# Patient Record
Sex: Male | Born: 2000 | Race: Black or African American | Hispanic: No | Marital: Single | Smoking: Never smoker
Health system: Southern US, Community
[De-identification: ages and names within clinical notes are randomized; demographics above are authoritative.]

## PROBLEM LIST (undated history)

## (undated) DIAGNOSIS — T7840XA Allergy, unspecified, initial encounter: Secondary | ICD-10-CM

## (undated) DIAGNOSIS — F909 Attention-deficit hyperactivity disorder, unspecified type: Secondary | ICD-10-CM

## (undated) DIAGNOSIS — J45909 Unspecified asthma, uncomplicated: Secondary | ICD-10-CM

## (undated) HISTORY — DX: Allergy, unspecified, initial encounter: T78.40XA

## (undated) HISTORY — DX: Attention-deficit hyperactivity disorder, unspecified type: F90.9

## (undated) HISTORY — DX: Unspecified asthma, uncomplicated: J45.909

---
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
## 2023-01-25 ENCOUNTER — Emergency Department (HOSPITAL_BASED_OUTPATIENT_CLINIC_OR_DEPARTMENT_OTHER): Payer: BC Managed Care – PPO

## 2023-01-25 ENCOUNTER — Emergency Department (HOSPITAL_BASED_OUTPATIENT_CLINIC_OR_DEPARTMENT_OTHER)
Admission: EM | Admit: 2023-01-25 | Discharge: 2023-01-25 | Disposition: A | Discharge status: Home / Self Care | Payer: BC Managed Care – PPO | Attending: Emergency Medicine | Admitting: Emergency Medicine

## 2023-01-25 ENCOUNTER — Other Ambulatory Visit: Payer: Self-pay

## 2023-01-25 ENCOUNTER — Encounter (HOSPITAL_BASED_OUTPATIENT_CLINIC_OR_DEPARTMENT_OTHER): Payer: Self-pay | Admitting: Radiology

## 2023-01-25 ENCOUNTER — Other Ambulatory Visit (HOSPITAL_BASED_OUTPATIENT_CLINIC_OR_DEPARTMENT_OTHER): Payer: Self-pay

## 2023-01-25 DIAGNOSIS — K6389 Other specified diseases of intestine: Secondary | ICD-10-CM

## 2023-01-25 DIAGNOSIS — R1031 Right lower quadrant pain: Secondary | ICD-10-CM | POA: Diagnosis present

## 2023-01-25 DIAGNOSIS — K659 Peritonitis, unspecified: Secondary | ICD-10-CM | POA: Insufficient documentation

## 2023-01-25 LAB — CBC
HCT: 43.5 % (ref 39.0–52.0)
Hemoglobin: 14.9 g/dL (ref 13.0–17.0)
MCH: 30.5 pg (ref 26.0–34.0)
MCHC: 34.3 g/dL (ref 30.0–36.0)
MCV: 89.1 fL (ref 80.0–100.0)
Platelets: 179 10*3/uL (ref 150–400)
RBC: 4.88 MIL/uL (ref 4.22–5.81)
RDW: 11.5 % (ref 11.5–15.5)
WBC: 5.9 10*3/uL (ref 4.0–10.5)
nRBC: 0 % (ref 0.0–0.2)

## 2023-01-25 LAB — URINALYSIS, ROUTINE W REFLEX MICROSCOPIC
Bilirubin Urine: NEGATIVE
Glucose, UA: NEGATIVE mg/dL
Hgb urine dipstick: NEGATIVE
Ketones, ur: NEGATIVE mg/dL
Leukocytes,Ua: NEGATIVE
Nitrite: NEGATIVE
Specific Gravity, Urine: 1.024 (ref 1.005–1.030)
pH: 8 (ref 5.0–8.0)

## 2023-01-25 LAB — COMPREHENSIVE METABOLIC PANEL
ALT: 6 U/L (ref 0–44)
AST: 15 U/L (ref 15–41)
Albumin: 4.9 g/dL (ref 3.5–5.0)
Alkaline Phosphatase: 42 U/L (ref 38–126)
Anion gap: 8 (ref 5–15)
BUN: 9 mg/dL (ref 6–20)
CO2: 26 mmol/L (ref 22–32)
Calcium: 10.1 mg/dL (ref 8.9–10.3)
Chloride: 105 mmol/L (ref 98–111)
Creatinine, Ser: 0.91 mg/dL (ref 0.61–1.24)
GFR, Estimated: >60 mL/min (ref >60)
Glucose, Bld: 86 mg/dL (ref 70–99)
Potassium: 3.9 mmol/L (ref 3.5–5.1)
Sodium: 139 mmol/L (ref 135–145)
Total Bilirubin: 1.1 mg/dL (ref 0.3–1.2)
Total Protein: 7.9 g/dL (ref 6.5–8.1)

## 2023-01-25 LAB — LIPASE, BLOOD: Lipase: 12 U/L (ref 11–51)

## 2023-01-25 MED ORDER — IOHEXOL 300 MG/ML  SOLN
100.0000 mL | Freq: Once | INTRAMUSCULAR | Status: AC | PRN
  Administered 2023-01-25: 75 mL via INTRAVENOUS

## 2023-01-25 MED ORDER — IBUPROFEN 600 MG PO TABS
600.0000 mg | ORAL_TABLET | Freq: Four times a day (QID) | ORAL | 0 refills | Status: AC | PRN
  Filled 2023-01-25: qty 30, 8d supply, fill #0

## 2023-01-25 NOTE — Discharge Instructions (Signed)
As discussed, symptoms are likely secondary to something called epiploic appendagitis.  This is not a life-threatening/emergent condition.  Recommend Tylenol/Motrin as needed for pain.  Symptoms will resolve with time.  Recommend follow-up with primary care for reassessment of your symptoms.  Please do not hesitate to return to emergency department if the worrisome signs or symptoms we discussed become apparent.

## 2023-01-25 NOTE — ED Triage Notes (Addendum)
Patient arrives with complaints of abdominal pain x1 day. Patient states that pain was worse with movement this morning and he was seen at Urgent Care. Patient was sent here to have imaging and rule out appendicitis.   Rates pain a 3/10

## 2023-01-25 NOTE — ED Provider Notes (Signed)
EMERGENCY DEPARTMENT AT [facility] Provider Note   CSN: [id number] Arrival date & time: 01/25/23  1014     History  Chief Complaint  Patient presents with   Abdominal Pain    Jason Jefferson is a 22 y.o. male.   Abdominal Pain   22 year old male presents emergency department with complaints of abdominal pain.  Patient is abdominal pain began this morning, is located in right lower abdomen.  Was seen urgent care earlier today and sent to the emergency department for further rule out of appendicitis.  States he was given to ibuprofen as well as Maalox to urgent care which has significantly improved his symptoms.  Presents emergency department for further assessment.  Denies fever, chills, chest pain, shortness of breath, nausea, vomiting, urinary symptoms, change in bowel habits.  Denies history abdominal surgeries.  Denies radiation of pain.  No significant pertinent past medical history. Home Medications Prior to Admission medications   Medication Sig Start Date End Date Taking? Authorizing Provider  ibuprofen (ADVIL) 600 MG tablet Take 1 tablet (600 mg total) by mouth every 6 (six) hours as needed. 01/25/23  Yes Sherian Maroon A, PA  methylphenidate (DAYTRANA) 20 MG/9HR Place 1 patch onto the skin daily. wear patch for 9 hours only each day    [provider]      Allergies    Patient has no known allergies.    Review of Systems   Review of Systems  Gastrointestinal:  Positive for abdominal pain.  All other systems reviewed and are negative.   Physical Exam Updated Vital Signs BP 119/79   Pulse 82   Temp 98 F (36.7 C) (Oral)   Resp 18   Ht 5\' 10"  (1.778 m)   Wt 72.6 kg   SpO2 100%   BMI 22.96 kg/m  Physical Exam Vitals and nursing note reviewed.  Constitutional:      General: He is not in acute distress.    Appearance: He is well-developed.  HENT:     Head: Normocephalic and atraumatic.  Eyes:     Conjunctiva/sclera: Conjunctivae  normal.  Cardiovascular:     Rate and Rhythm: Normal rate and regular rhythm.     Heart sounds: No murmur heard. Pulmonary:     Effort: Pulmonary effort is normal. No respiratory distress.     Breath sounds: Normal breath sounds.  Abdominal:     Palpations: Abdomen is soft.     Tenderness: There is abdominal tenderness in the right lower quadrant. There is no right CVA tenderness or left CVA tenderness.     Comments: Mild right lower quadrant tenderness to palpation.  Musculoskeletal:        General: No swelling.     Cervical back: Neck supple.  Skin:    General: Skin is warm and dry.     Capillary Refill: Capillary refill takes less than 2 seconds.  Neurological:     Mental Status: He is alert.  Psychiatric:        Mood and Affect: Mood normal.     ED Results / Procedures / Treatments   Labs (all labs ordered are listed, but only abnormal results are displayed) Labs Reviewed  URINALYSIS, ROUTINE W REFLEX MICROSCOPIC - Abnormal; Notable for the following components:      Result Value   Protein, ur TRACE (*)    All other components within normal limits  LIPASE, BLOOD  COMPREHENSIVE METABOLIC PANEL  CBC    EKG None  Radiology CT  ABDOMEN PELVIS W CONTRAST  Result Date: 01/25/2023 CLINICAL DATA:  One day history of right lower quadrant abdominal pain EXAM: CT ABDOMEN AND PELVIS WITH CONTRAST TECHNIQUE: Multidetector CT imaging of the abdomen and pelvis was performed using the standard protocol following bolus administration of intravenous contrast. RADIATION DOSE REDUCTION: This exam was performed according to the departmental dose-optimization program which includes automated exposure control, adjustment of the mA and/or kV according to patient size and/or use of iterative reconstruction technique. CONTRAST:  75mL OMNIPAQUE IOHEXOL 300 MG/ML  SOLN COMPARISON:  None Available. FINDINGS: Lower chest: No focal consolidation or pulmonary nodule in the lung bases. No pleural  effusion or pneumothorax demonstrated. Partially imaged heart size is normal. Hepatobiliary: No focal hepatic lesions. Mild periportal edema. No intra or extrahepatic biliary ductal dilation. Normal gallbladder. Pancreas: No focal lesions or main ductal dilation. Spleen: Normal in size without focal abnormality. Splenule along the superomedial spleen. Adrenals/Urinary Tract: No adrenal nodules. No suspicious renal mass, calculi or hydronephrosis. No focal bladder wall thickening. Stomach/Bowel: Normal appearance of the stomach. No evidence of bowel wall thickening or distention. Pericolonic hyperattenuating focus with surrounding mesenteric stranding (2:45). Scattered ascending colonic diverticula. Normal appendix. Vascular/Lymphatic: No significant vascular findings are present. No enlarged abdominal or pelvic lymph nodes. Reproductive: Prostate is unremarkable. Other: Trace pelvic free fluid.  No free air or fluid collection. Musculoskeletal: No acute or abnormal lytic or blastic osseous lesions. IMPRESSION: 1. Findings suggestive of epiploic appendagitis of the ascending colon. 2. Scattered ascending colonic diverticula with no findings of acute diverticulitis. 3. Normal appendix. Electronically Signed   By: Agustin Cree M.D.   On: 01/25/2023 12:08    Procedures Procedures    Medications Ordered in ED Medications  iohexol (OMNIPAQUE) 300 MG/ML solution 100 mL (75 mLs Intravenous Contrast Given 01/25/23 1144)    ED Course/ Medical Decision Making/ A&P                             Medical Decision Making Amount and/or Complexity of Data Reviewed Labs: ordered. Radiology: ordered.  Risk Prescription drug management.   This patient presents to the ED for concern of abdominal pain, this involves an extensive number of treatment options, and is a complaint that carries with it a high risk of complications and morbidity.  The differential diagnosis includes gastritis, pancreatitis, CBD pathology,  cholecystitis, hepatitis, SBO/LBO, volvulus, diverticulitis, appendicitis, pyelonephritis, nephrolithiasis, cystitis    Co morbidities that complicate the patient evaluation  See HPI   Additional history obtained:  Additional history obtained from EMR External records from outside source obtained and reviewed including hospital records   Lab Tests:  I Ordered, and personally interpreted labs.  The pertinent results include: No leukocytosis.  No evidence of anemia.  Platelets within range.  No electrolyte abnormalities.  No transaminitis.  No renal dysfunction.  Lipase within normal limits.  UA significant for trace protein but otherwise unremarkable.   Imaging Studies ordered:  I ordered imaging studies including CT abdomen pelvis I independently visualized and interpreted imaging which showed epiploic appendagitis of ascending colon.  Scattered colonic diverticula without evidence of diverticulitis.  Normal appendix. I agree with the radiologist interpretation  Cardiac Monitoring: / EKG:  The patient was maintained on a cardiac monitor.  I personally viewed and interpreted the cardiac monitored which showed an underlying rhythm of: Sinus rhythm   Consultations Obtained:  N/a   Problem List / ED Course / Critical interventions / Medication  management  Epiploic appendagitis Reevaluation of the patient showed that the patient stayed the same I have reviewed the patients home medicines and have made adjustments as needed   Social Determinants of Health:  Denies tobacco, illicit drug use   Test / Admission - Considered:  Epiploic appendagitis Vitals signs  within normal range and stable throughout visit. Laboratory/imaging studies significant for: See above 22 year old male presents emergency department with right lower quadrant pain after being seen by urgent care and referred to the emergency department for further assessment.  Patient noted significant improvement  of symptoms with oral Motrin given by urgent care prior to arrival.  Patient found with evidence of epiploic appendagitis.  Patient overall well-appearing, afebrile without leukocytosis, tolerating p.o. without difficulty.  Patient reassured by workup and self-limited nature of disease process of epiploic appendagitis.  Patient recommended Tylenol/Motrin as needed for pain in the interim.  Follow-up information given.  Treatment plan discussed at length with patient and he acknowledged understanding was agreeable to said plan. Worrisome signs and symptoms were discussed with the patient, and the patient acknowledged understanding to return to the ED if noticed. Patient was stable upon discharge.          Final Clinical Impression(s) / ED Diagnoses Final diagnoses:  Epiploic appendagitis    Rx / DC Orders ED Discharge Orders          Ordered    ibuprofen (ADVIL) 600 MG tablet  Every 6 hours PRN        01/25/23 1257              Peter Garter, [place] 01/25/23 1319    Arby Barrette, MD 02/04/23 1940

## 2023-01-25 NOTE — ED Notes (Signed)
Pt verbalized understanding of d/c instructions, meds, and followup care. Denies questions. VSS, no distress noted. Steady gait to exit with all belongings.  ?
